# Patient Record
Sex: Male | Born: 1995 | Hispanic: Yes | Marital: Single | Smoking: Current every day smoker
Health system: Southern US, Community
[De-identification: ages and names within clinical notes are randomized; demographics above are authoritative.]

---

## 2019-08-01 ENCOUNTER — Encounter (HOSPITAL_COMMUNITY): Payer: Self-pay | Admitting: Emergency Medicine

## 2019-08-01 ENCOUNTER — Emergency Department (HOSPITAL_COMMUNITY)
Admission: EM | Admit: 2019-08-01 | Discharge: 2019-08-01 | Disposition: A | Discharge status: Home / Self Care | Payer: BC Managed Care – PPO | Attending: Emergency Medicine | Admitting: Emergency Medicine

## 2019-08-01 ENCOUNTER — Emergency Department (HOSPITAL_COMMUNITY): Payer: BC Managed Care – PPO

## 2019-08-01 ENCOUNTER — Other Ambulatory Visit: Payer: Self-pay

## 2019-08-01 DIAGNOSIS — R1084 Generalized abdominal pain: Secondary | ICD-10-CM | POA: Diagnosis not present

## 2019-08-01 DIAGNOSIS — Z79899 Other long term (current) drug therapy: Secondary | ICD-10-CM | POA: Diagnosis not present

## 2019-08-01 DIAGNOSIS — F17210 Nicotine dependence, cigarettes, uncomplicated: Secondary | ICD-10-CM | POA: Diagnosis not present

## 2019-08-01 LAB — URINALYSIS, ROUTINE W REFLEX MICROSCOPIC
Bilirubin Urine: NEGATIVE
Glucose, UA: NEGATIVE mg/dL
Hgb urine dipstick: NEGATIVE
Ketones, ur: NEGATIVE mg/dL
Leukocytes,Ua: NEGATIVE
Nitrite: NEGATIVE
Protein, ur: NEGATIVE mg/dL
Specific Gravity, Urine: 1.021 (ref 1.005–1.030)
pH: 5 (ref 5.0–8.0)

## 2019-08-01 LAB — CBC
HCT: 51.9 % (ref 39.0–52.0)
Hemoglobin: 17.2 g/dL — ABNORMAL HIGH (ref 13.0–17.0)
MCH: 28.9 pg (ref 26.0–34.0)
MCHC: 33.1 g/dL (ref 30.0–36.0)
MCV: 87.2 fL (ref 80.0–100.0)
Platelets: 213 10*3/uL (ref 150–400)
RBC: 5.95 MIL/uL — ABNORMAL HIGH (ref 4.22–5.81)
RDW: 12.5 % (ref 11.5–15.5)
WBC: 8.5 10*3/uL (ref 4.0–10.5)
nRBC: 0 % (ref 0.0–0.2)

## 2019-08-01 LAB — COMPREHENSIVE METABOLIC PANEL
ALT: 22 U/L (ref 0–44)
AST: 15 U/L (ref 15–41)
Albumin: 4.8 g/dL (ref 3.5–5.0)
Alkaline Phosphatase: 123 U/L (ref 38–126)
Anion gap: 9 (ref 5–15)
BUN: 18 mg/dL (ref 6–20)
CO2: 24 mmol/L (ref 22–32)
Calcium: 9.3 mg/dL (ref 8.9–10.3)
Chloride: 104 mmol/L (ref 98–111)
Creatinine, Ser: 0.87 mg/dL (ref 0.61–1.24)
GFR calc Af Amer: >60 mL/min (ref >60)
GFR calc non Af Amer: >60 mL/min (ref >60)
Glucose, Bld: 96 mg/dL (ref 70–99)
Potassium: 3.9 mmol/L (ref 3.5–5.1)
Sodium: 137 mmol/L (ref 135–145)
Total Bilirubin: 0.9 mg/dL (ref 0.3–1.2)
Total Protein: 8 g/dL (ref 6.5–8.1)

## 2019-08-01 LAB — RAPID URINE DRUG SCREEN, HOSP PERFORMED
Amphetamines: POSITIVE — AB
Barbiturates: NOT DETECTED
Benzodiazepines: NOT DETECTED
Cocaine: NOT DETECTED
Opiates: POSITIVE — AB
Tetrahydrocannabinol: POSITIVE — AB

## 2019-08-01 LAB — LIPASE, BLOOD: Lipase: 40 U/L (ref 11–51)

## 2019-08-01 MED ORDER — SODIUM CHLORIDE (PF) 0.9 % IJ SOLN
INTRAMUSCULAR | Status: AC
  Filled 2019-08-01: qty 50

## 2019-08-01 MED ORDER — SODIUM CHLORIDE 0.9 % IV BOLUS
1000.0000 mL | Freq: Once | INTRAVENOUS | Status: AC
  Administered 2019-08-01: 1000 mL via INTRAVENOUS

## 2019-08-01 MED ORDER — MORPHINE SULFATE (PF) 4 MG/ML IV SOLN
4.0000 mg | Freq: Once | INTRAVENOUS | Status: AC
  Administered 2019-08-01: 4 mg via INTRAVENOUS
  Filled 2019-08-01: qty 1

## 2019-08-01 MED ORDER — IOHEXOL 300 MG/ML  SOLN
100.0000 mL | Freq: Once | INTRAMUSCULAR | Status: AC | PRN
  Administered 2019-08-01: 15:00:00 100 mL via INTRAVENOUS

## 2019-08-01 NOTE — ED Provider Notes (Signed)
[facility]-EMERGENCY DEPT Provider Note   CSN: [id number] Arrival date & time: 08/01/19  0847     History   Chief Complaint Chief Complaint  Patient presents with  . Abdominal Pain  . Diarrhea  . Back Pain    HPI Zachary Maynard is a 23 y.o. male.     23 y.o male with no PMH presents to the ED with a chief complaint of abdominal pain x 4 days. He describes the pain as a constant sharp sensation with radiation from his RUQ to his back. He reports the pain is exacerbated with eating along with lying flat. His pain is also alleviated with pressing of his right upper quadrant. He has been drinking plenty of fluids without improvement in symptoms. He also endorses one episode of non bilious, non bloody emesis prior to arrival. A subjective fever was also recorded by patient's girlfriend on Saturday along with chills and one episode of diarrhea. Patient states he feels "food is not getting processed, my stomach is swollen". He denies any prior surgical history to his abdomen. His last bowel movement was Saturday, without any blood. He denies alcohol or drug use. No shortness of breath,  Of note, he is currently on day 2 of a steroid pack for a rash, likely acquired while at work.   The history is provided by the patient.  Abdominal Pain Associated symptoms: chills, constipation, diarrhea, fever, nausea and vomiting   Associated symptoms: no chest pain, no shortness of breath and no sore throat   Diarrhea Associated symptoms: abdominal pain, chills, fever and vomiting   Associated symptoms: no headaches   Back Pain Associated symptoms: abdominal pain and fever   Associated symptoms: no chest pain and no headaches     History reviewed. No pertinent past medical history.  There are no active problems to display for this patient.   History reviewed. No pertinent surgical history.      Home Medications    Prior to Admission medications   Medication Sig  Start Date End Date Taking? Authorizing Provider  cetirizine (ZYRTEC) 10 MG tablet Take 10 mg by mouth at bedtime. 07/24/19   [provider]  montelukast (SINGULAIR) 10 MG tablet Take 10 mg by mouth daily. 07/24/19   [provider]  permethrin (ELIMITE) 5 % cream Apply 1 application topically See admin instructions. Apply to skin from neck down to feet and wash off after 8 hours repeat in 1 week. 06/01/19   [provider]  predniSONE (DELTASONE) 10 MG tablet Take 10 mg by mouth 2 (two) times daily. 07/24/19   [provider]    Family History No family history on file.  Social History Social History   Tobacco Use  . Smoking status: Current Every Day Smoker    Types: Cigarettes  . Smokeless tobacco: Never Used  Substance Use Topics  . Alcohol use: Yes  . Drug use: Not on file     Allergies   Patient has no allergy information on record.   Review of Systems Review of Systems  Constitutional: Positive for chills and fever.  HENT: Negative for sneezing and sore throat.   Eyes: Negative for photophobia.  Respiratory: Negative for shortness of breath.   Cardiovascular: Negative for chest pain.  Gastrointestinal: Positive for abdominal pain, constipation, diarrhea, nausea and vomiting. Negative for abdominal distention and blood in stool.  Genitourinary: Negative for difficulty urinating, discharge and flank pain.  Musculoskeletal: Positive for back pain and neck pain.  Skin: Negative for pallor and wound.  Neurological: Negative for light-headedness and headaches.     Physical Exam Updated Vital Signs BP (!) 129/92   Pulse 69   Temp 98.2 F (36.8 C) (Oral)   Resp 16   SpO2 100%   Physical Exam Vitals signs and nursing note reviewed.  Constitutional:      Appearance: He is well-developed. He is ill-appearing.  HENT:     Head: Normocephalic and atraumatic.     Mouth/Throat:     Mouth: Mucous membranes are moist.  Cardiovascular:      Rate and Rhythm: Normal rate.  Pulmonary:     Effort: Pulmonary effort is normal.     Breath sounds: No wheezing, rhonchi or rales.     Comments: Lungs are clear to auscultation.  No wheezing, rhonchi, rales. Abdominal:     General: Abdomen is flat. Bowel sounds are decreased. There is distension.     Palpations: Abdomen is soft.     Tenderness: There is abdominal tenderness in the right upper quadrant and right lower quadrant. There is right CVA tenderness and guarding. There is no left CVA tenderness. Positive signs include McBurney's sign. Negative signs include Murphy's sign and psoas sign.     Comments: Bowel sounds are diminished.  There is guarding on exam.  Significant tenderness along the right upper quadrant.  Skin:    General: Skin is warm and dry.  Neurological:     Mental Status: He is alert and oriented to person, place, and time.      ED Treatments / Results  Labs (all labs ordered are listed, but only abnormal results are displayed) Labs Reviewed  CBC - Abnormal; Notable for the following components:      Result Value   RBC 5.95 (*)    Hemoglobin 17.2 (*)    All other components within normal limits  RAPID URINE DRUG SCREEN, HOSP PERFORMED - Abnormal; Notable for the following components:   Opiates POSITIVE (*)    Amphetamines POSITIVE (*)    Tetrahydrocannabinol POSITIVE (*)    All other components within normal limits  LIPASE, BLOOD  COMPREHENSIVE METABOLIC PANEL  URINALYSIS, ROUTINE W REFLEX MICROSCOPIC    EKG None  Radiology [place] Abdomen Complete  Result Date: 08/01/2019 CLINICAL DATA:  Right upper quadrant abdominal pain for 5 days EXAM: ABDOMEN ULTRASOUND COMPLETE COMPARISON:  None. FINDINGS: Gallbladder: No gallstones or wall thickening visualized. No sonographic Murphy sign noted by sonographer. Common bile duct: Diameter: 3 mm Liver: No focal lesion identified. Within normal limits in parenchymal echogenicity. Portal vein is patent on color  Doppler imaging with normal direction of blood flow towards the liver. IVC: No abnormality visualized. Pancreas: Largely obscured by overlying bowel gas. Spleen: Size and appearance within normal limits. Right Kidney: Length: 9.7 cm. Echogenicity within normal limits. No mass or hydronephrosis visualized. Left Kidney: Length: 11.3 cm. Echogenicity within normal limits. No mass or hydronephrosis visualized. Abdominal aorta: No aneurysm visualized. Other findings: None. IMPRESSION: 1. No acute sonographic findings within the abdomen. 2. Nonvisualization of the pancreas secondary to shadowing from overlying bowel gas. Electronically Signed   By: Duanne GuessNicholas  Plundo M.D.   On: 08/01/2019 12:38   Ct Abdomen Pelvis W Contrast  Result Date: 08/01/2019 CLINICAL DATA:  Abdominal pain EXAM: CT ABDOMEN AND PELVIS WITH CONTRAST TECHNIQUE: Multidetector CT imaging of the abdomen and pelvis was performed using the standard protocol following bolus administration of intravenous contrast. CONTRAST:  100mL OMNIPAQUE IOHEXOL 300 MG/ML  SOLN COMPARISON:  Ultrasound abdomen August 01, 2019 FINDINGS: Lower chest: Lung bases are clear. Hepatobiliary: No focal liver lesions are demonstrable. The gallbladder wall is not appreciably thickened. There is no biliary duct dilatation. Pancreas: There is no pancreatic mass or inflammatory focus. Spleen: No splenic lesions are evident. Adrenals/Urinary Tract: Adrenals bilaterally appear unremarkable. Kidneys bilaterally show no evident mass or hydronephrosis on either side. There is no evident renal or ureteral calculus on either side. Urinary bladder is midline with wall thickness within normal limits. Stomach/Bowel: There is no appreciable bowel wall or mesenteric thickening. Terminal ileum appears normal. There is no evident bowel obstruction. There is no appreciable free air or portal venous air. Vascular/Lymphatic: There is no abdominal aortic aneurysm. No vascular lesions are evident. No  adenopathy is evident in the abdomen or pelvis. Reproductive: Prostate and seminal vesicles are normal in size and contour. There is no evident pelvic mass. Other: Appendix region appears normal. No periappendiceal region inflammatory change. There is no evident abscess or ascites in the abdomen or pelvis. There is a small hernia slightly superior to the umbilicus containing fat but no bowel. Musculoskeletal: There are no blastic or lytic bone lesions. No intramuscular lesions evident. IMPRESSION: 1. A cause for patient's symptoms has not been established with this study. 2. No evident bowel obstruction. No abscess in the abdomen pelvis. Appendix appears normal. 3. No evident renal or ureteral calculus. No hydronephrosis. Urinary bladder wall thickness is within normal limits. 4. Small ventral hernia slightly superior to the umbilicus containing fat but no bowel. Electronically Signed   By: Lowella Grip III M.D.   On: 08/01/2019 15:34    Procedures Procedures (including critical care time)  Medications Ordered in ED Medications  sodium chloride (PF) 0.9 % injection (has no administration in time range)  sodium chloride 0.9 % bolus 1,000 mL (0 mLs Intravenous Stopped 08/01/19 1351)  morphine 4 MG/ML injection 4 mg (4 mg Intravenous Given 08/01/19 1201)  iohexol (OMNIPAQUE) 300 MG/ML solution 100 mL (100 mLs Intravenous Contrast Given 08/01/19 1451)     Initial Impression / Assessment and Plan / ED Course  I have reviewed the triage vital signs and the nursing notes.  Pertinent labs & imaging results that were available during my care of the patient were reviewed by me and considered in my medical decision making (see chart for details).  Clinical Course as of Jul 31 1644  Tue Aug 01, 2019  1643 Opiates(!): POSITIVE [JS]  1643 Amphetamines(!): POSITIVE [JS]  1643 Tetrahydrocannabinol(!): POSITIVE [JS]    Clinical Course User Index [JS] Janeece Fitting, PA-C      Patient with no  pertinent past medical history presents to the ED with complaints of right-sided abdominal pain for the past 4 days.  Patient also endorses some vomiting, chills, subjective fever.  He is also had some episodes of diarrhea however now reports constipation after his last bowel movement on Saturday.  He is overall nontoxic-appearing, arrived in the ED afebrile.  Has not really taken much over-the-counter medication for improvement in symptoms aside for continue to hydrate with plenty of liquids.  Does endorse somewhat anorexia.  Differential diagnoses included but not limited to cholelithiasis, nephrolithiasis, appendicitis versus enteritis.  CBC without any leukocytosis, hemoconcentrated.  CMP without any electrolyte derangement, creatinine level is within normal limits.  LFTs are unremarkable.  UA is currently pending.  Patient was given morphine to help with his symptoms along with fluids to help with hydration.  Will order ultrasound to further evaluate his  right upper quadrant pain.  Ultrasound of the right upper quadrant showed 1. No acute sonographic findings within the abdomen.  2. Nonvisualization of the pancreas secondary to shadowing from  overlying bowel gas.     Will order CT abdomen to further evaluate as there is some suspicion for obstruction: 1. A cause for patient's symptoms has not been established with this  study.    2. No evident bowel obstruction. No abscess in the abdomen pelvis.  Appendix appears normal.    3. No evident renal or ureteral calculus. No hydronephrosis. Urinary  bladder wall thickness is within normal limits.    4. Small ventral hernia slightly superior to the umbilicus  containing fat but no bowel.     These results were discussed with patient at length.  Patient will be provided with a p.o. challenge.  UA was positive for THC, amphetamines, opiates.  He did have one episode of not feeling unwell after CT contrast, some suspicion this likely had to  do due to drugs on board.   Patient is otherwise well-appearing, nontoxic vital signs, tolerating p.o.  We will have him follow-up with PCP as needed.  I have discussed patient with Dr. Stevie Kern who agrees with plan and management.    Portions of this note were generated with Scientist, clinical (histocompatibility and immunogenetics). Dictation errors may occur despite best attempts at proofreading.  Final Clinical Impressions(s) / ED Diagnoses   Final diagnoses:  Generalized abdominal pain    ED Discharge Orders    None       Claude Manges, PA-C 08/01/19 1645    Milagros Loll, MD 08/02/19 1015

## 2019-08-01 NOTE — ED Triage Notes (Signed)
Pt c/o abd pains that is worse after eating.pain radiates to back. Also having diarrhea. Is currently taking steroids for rash/allergy since last Monday.

## 2019-08-01 NOTE — ED Notes (Signed)
Pt was able to keep down crackers and water that was offered. He states he does still have some lingering nausea.

## 2019-08-01 NOTE — Discharge Instructions (Addendum)
Your laboratory results along with CT scan today were negative.  Please continue to hydrate with plenty of liquids along with Gatorade.Please refrain from any THC use.   A referral for [place] and wellness has been given to you, please schedule an appointment in order to establish primary care.

## 2020-05-12 IMAGING — CT CT ABD-PELV W/ CM
2 of 4 series · 15 of 46 positions shown, 17 images · IV contrast (omnipaque)
Comparison: Ultrasound abdomen August 01, 2019

CLINICAL DATA: Abdominal pain

EXAM:
CT ABDOMEN AND PELVIS WITH CONTRAST
TECHNIQUE: Multidetector CT imaging of the abdomen and pelvis was performed
using the standard protocol following bolus administration of
intravenous contrast.
CONTRAST:  100mL OMNIPAQUE IOHEXOL 300 MG/ML  SOLN

[Series 2: axial st · axial · 0.80mm/px · z∈[+1018,+1458]mm · 12 of 98 slices shown, 14 images]
[im 5/98  soft-tissue]
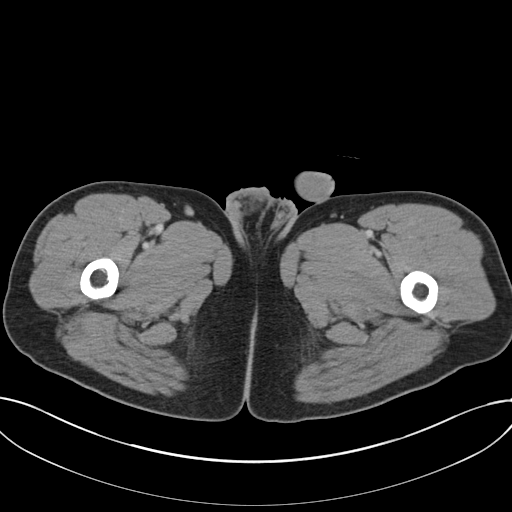
[im 5/98  bone]
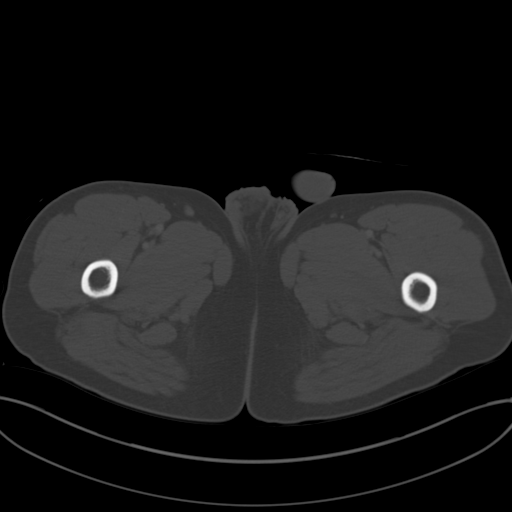
[im 14/98  soft-tissue]
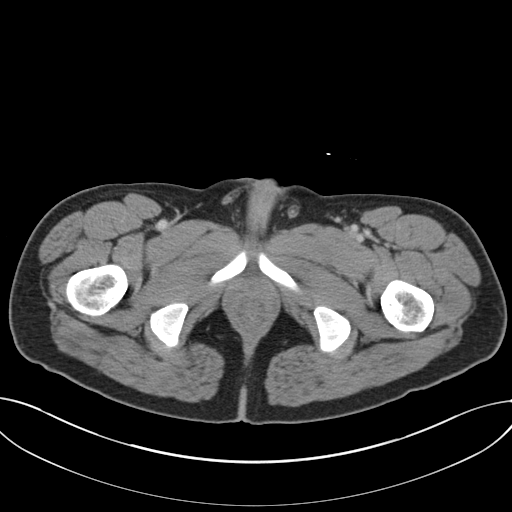
[im 24/98  soft-tissue]
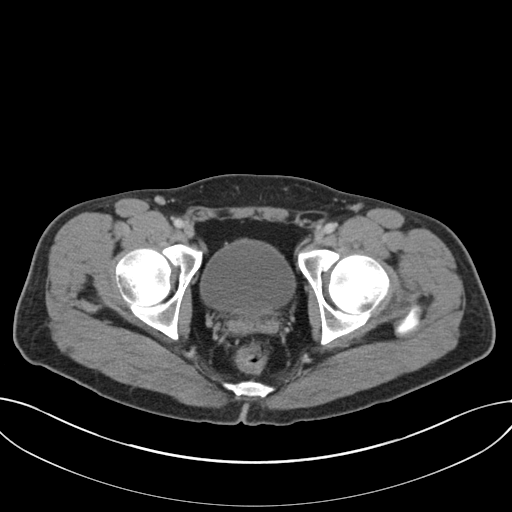
[im 28/98  soft-tissue]
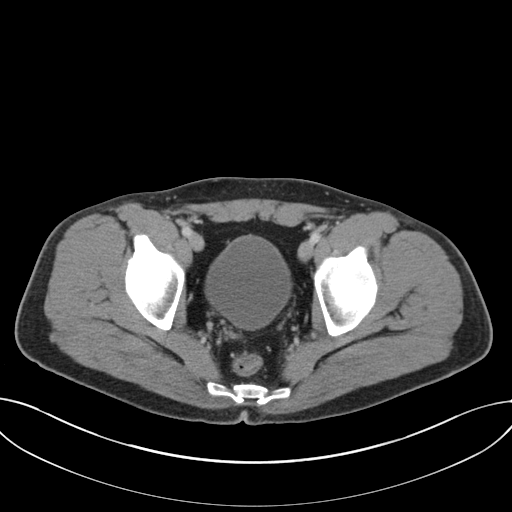
[im 37/98  soft-tissue]
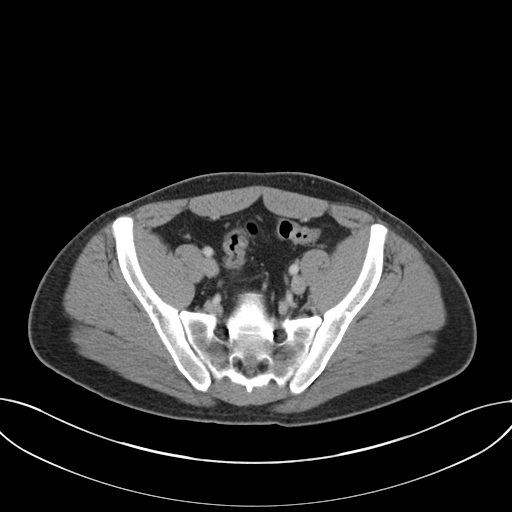
[im 47/98  soft-tissue]
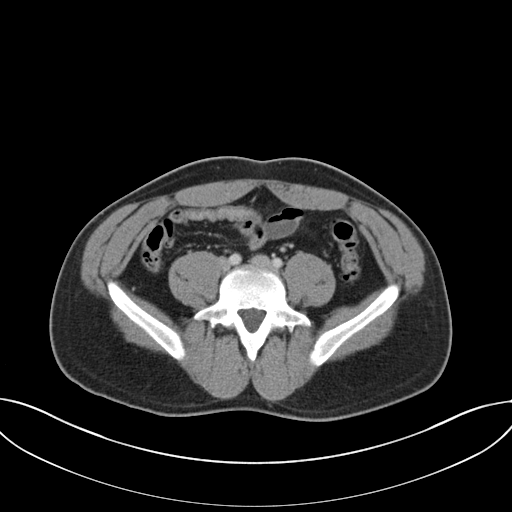
[im 51/98  soft-tissue]
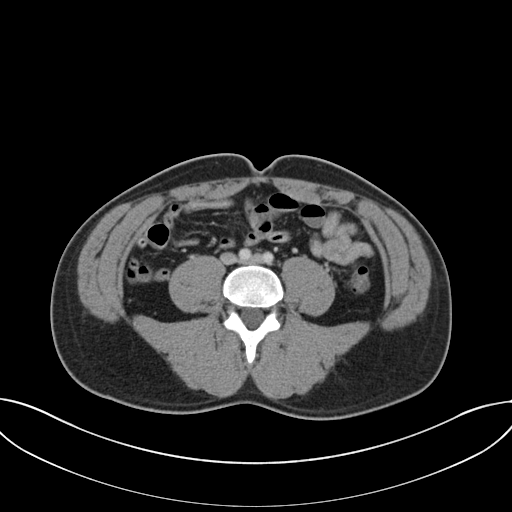
[im 61/98  soft-tissue]
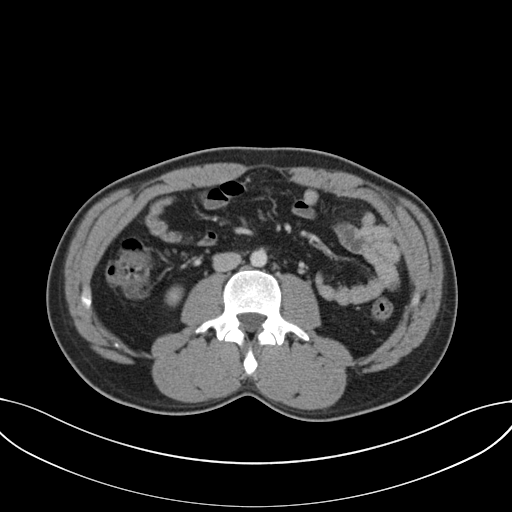
[im 70/98  soft-tissue]
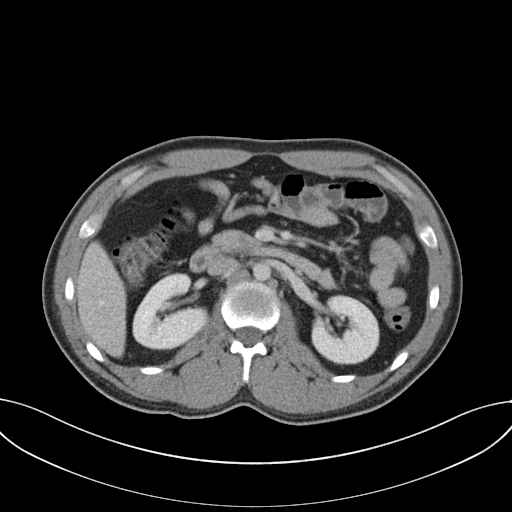
[im 70/98  bone]
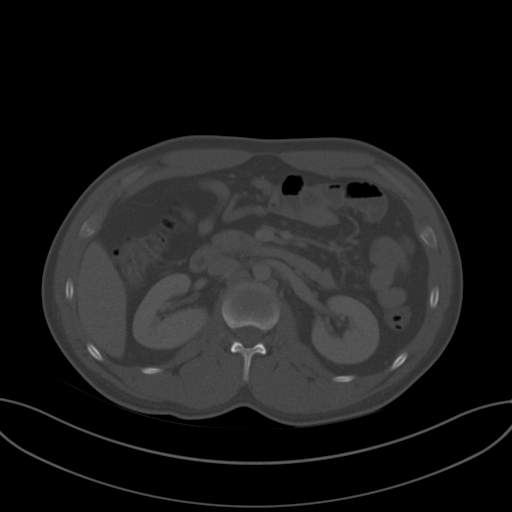
[im 74/98  soft-tissue]
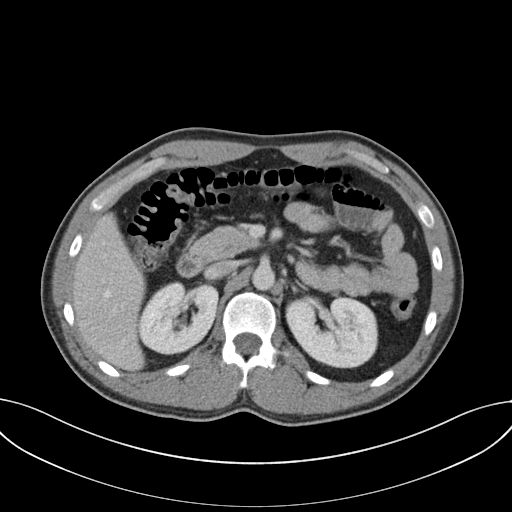
[im 84/98  soft-tissue]
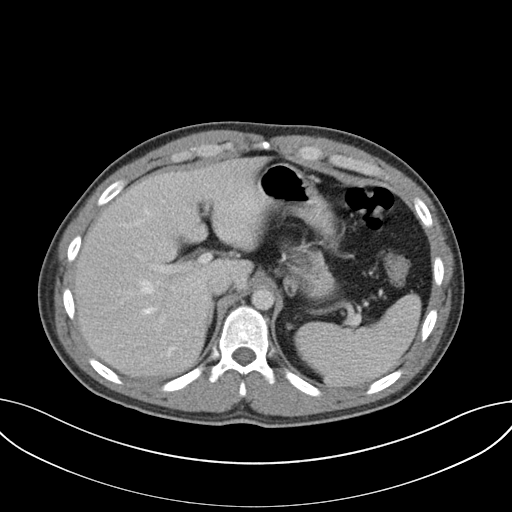
[im 93/98  soft-tissue]
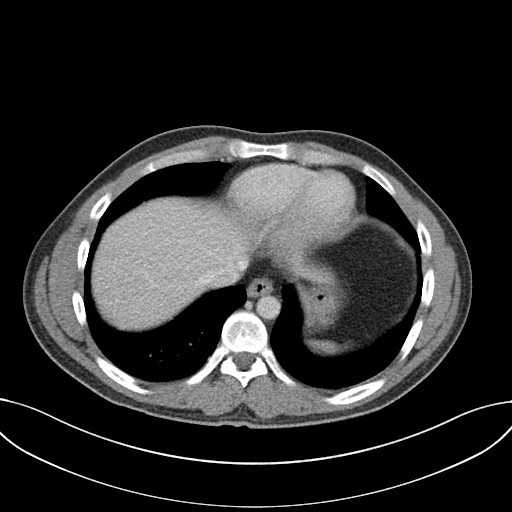

[Series 4: coronal st · coronal · 0.70mm/px · 3 of 130 slices shown]
[im 44/130  soft-tissue]
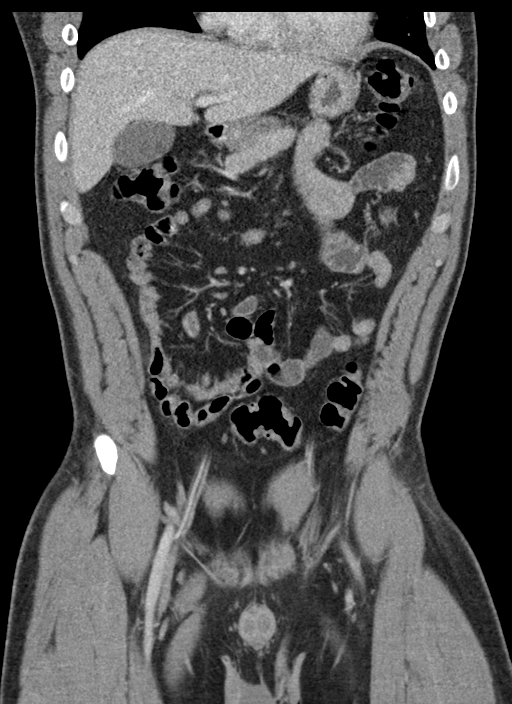
[im 58/130  soft-tissue]
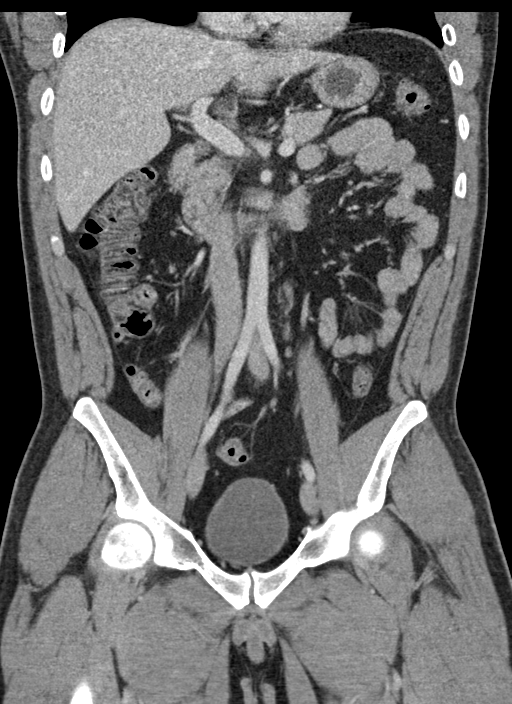
[im 72/130  soft-tissue]
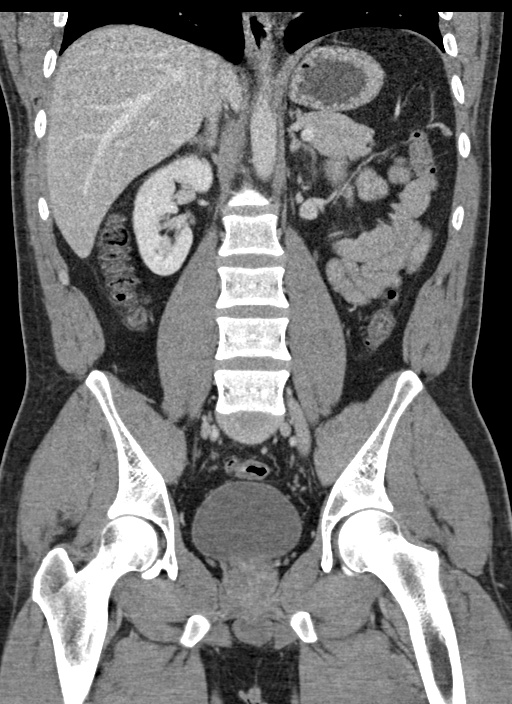

[15 of 46 positions shown; findings below may reference images not displayed]

FINDINGS: Lower chest: Lung bases are clear.

Hepatobiliary: No focal liver lesions are demonstrable. The
gallbladder wall is not appreciably thickened. There is no biliary
duct dilatation.

Pancreas: There is no pancreatic mass or inflammatory focus.

Spleen: No splenic lesions are evident.

Adrenals/Urinary Tract: Adrenals bilaterally appear unremarkable.
Kidneys bilaterally show no evident mass or hydronephrosis on either
side. There is no evident renal or ureteral calculus on either side.
Urinary bladder is midline with wall thickness within normal limits.

Stomach/Bowel: There is no appreciable bowel wall or mesenteric
thickening. Terminal ileum appears normal. There is no evident bowel
obstruction. There is no appreciable free air or portal venous air.

Vascular/Lymphatic: There is no abdominal aortic aneurysm. No
vascular lesions are evident. No adenopathy is evident in the
abdomen or pelvis.

Reproductive: Prostate and seminal vesicles are normal in size and
contour. There is no evident pelvic mass.

Other: Appendix region appears normal. No periappendiceal region
inflammatory change. There is no evident abscess or ascites in the
abdomen or pelvis. There is a small hernia slightly superior to the
umbilicus containing fat but no bowel.

Musculoskeletal: There are no blastic or lytic bone lesions. No
intramuscular lesions evident.
IMPRESSION: 1. A cause for patient's symptoms has not been established with this
study.

2. No evident bowel obstruction. No abscess in the abdomen pelvis.
Appendix appears normal.

3. No evident renal or ureteral calculus. No hydronephrosis. Urinary
bladder wall thickness is within normal limits.

4. Small ventral hernia slightly superior to the umbilicus
containing fat but no bowel.

## 2020-05-12 IMAGING — US US ABDOMEN COMPLETE
1 series · 14 of 25 positions shown · non-contrast
Comparison: None.

CLINICAL DATA: Right upper quadrant abdominal pain for 5 days

EXAM:
ABDOMEN ULTRASOUND COMPLETE

[Series 1: us abdomen complete · 14 of 86 slices shown]
[im 1/86]
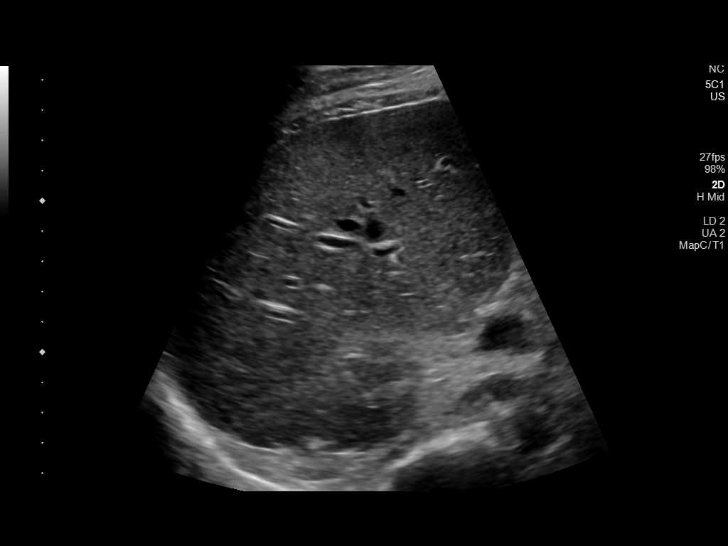
[im 8/86]
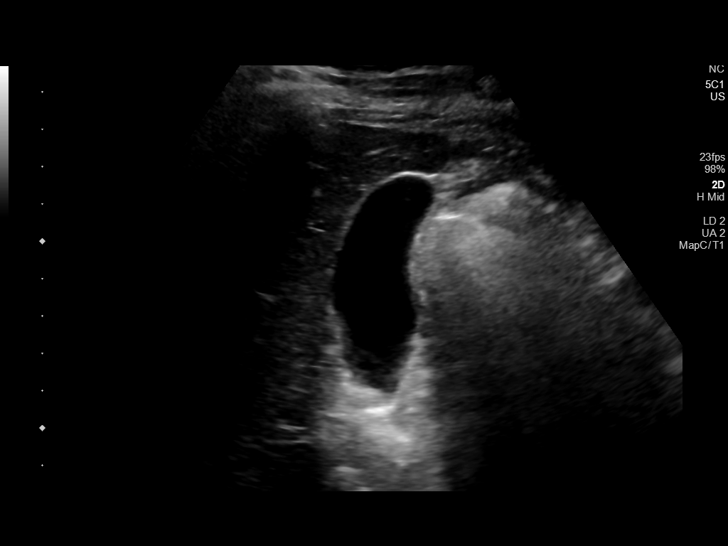
[im 15/86]
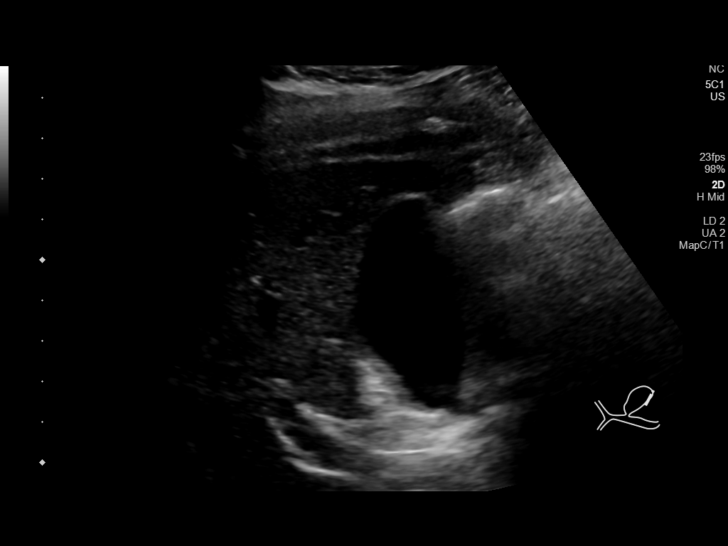
[im 22/86]
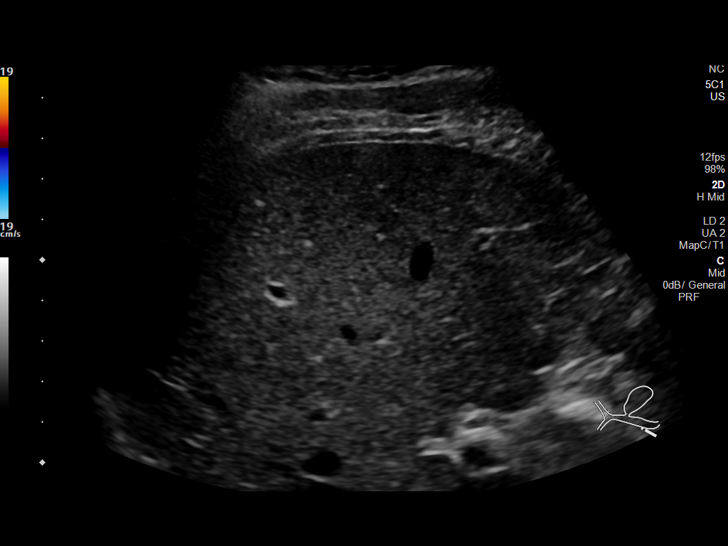
[im 29/86]
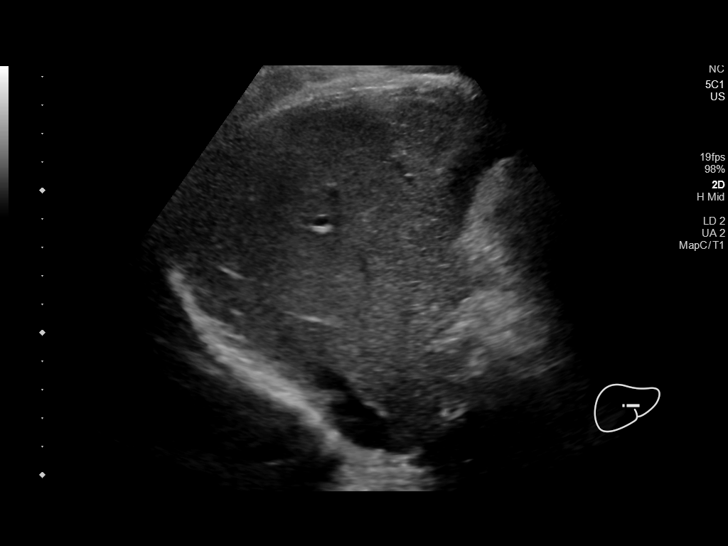
[im 32/86]
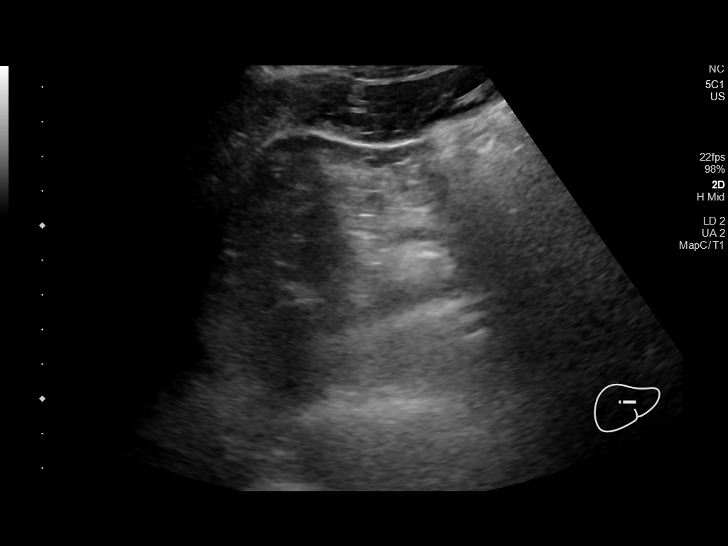
[im 39/86]
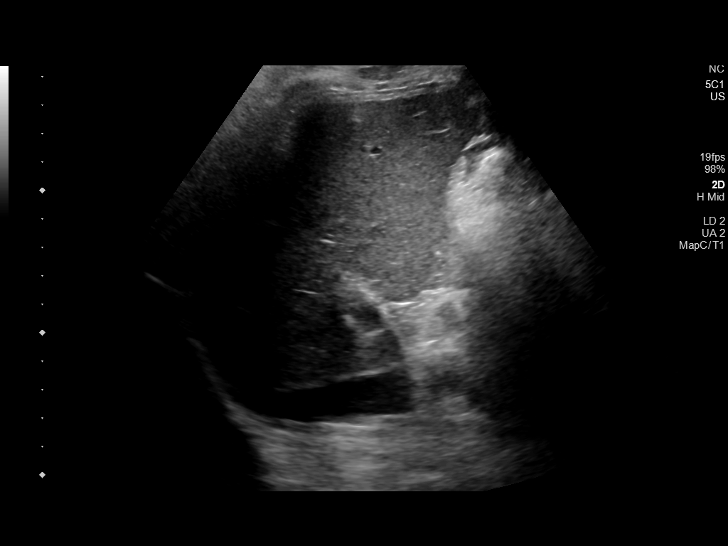
[im 47/86]
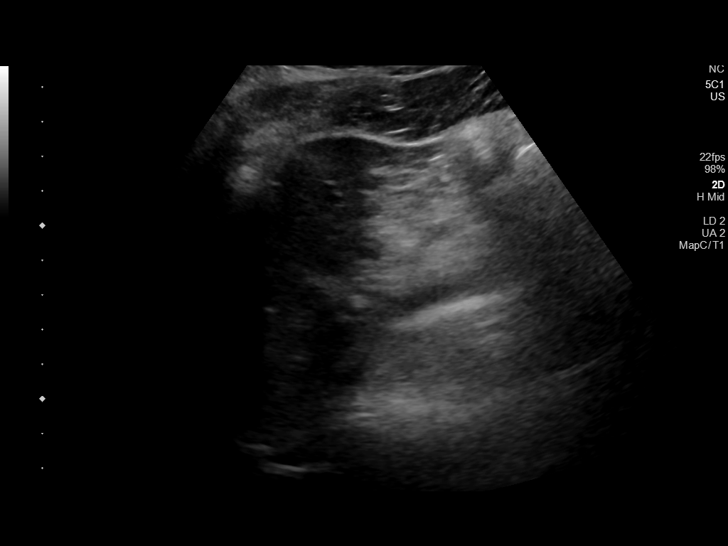
[im 54/86]
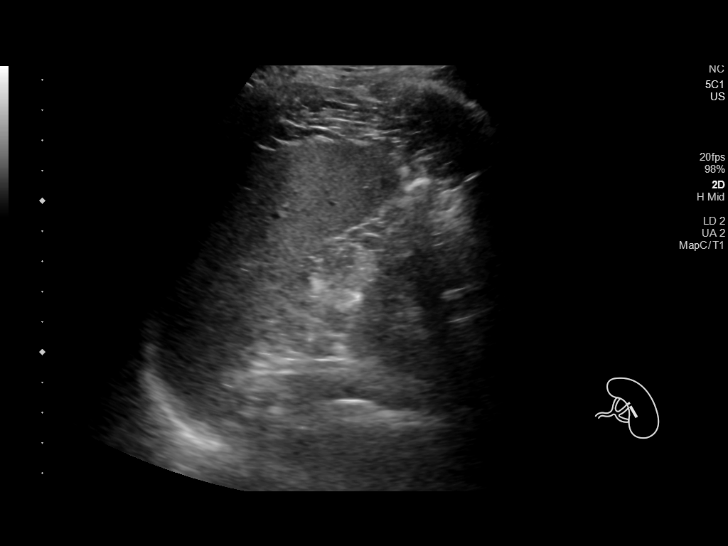
[im 57/86]
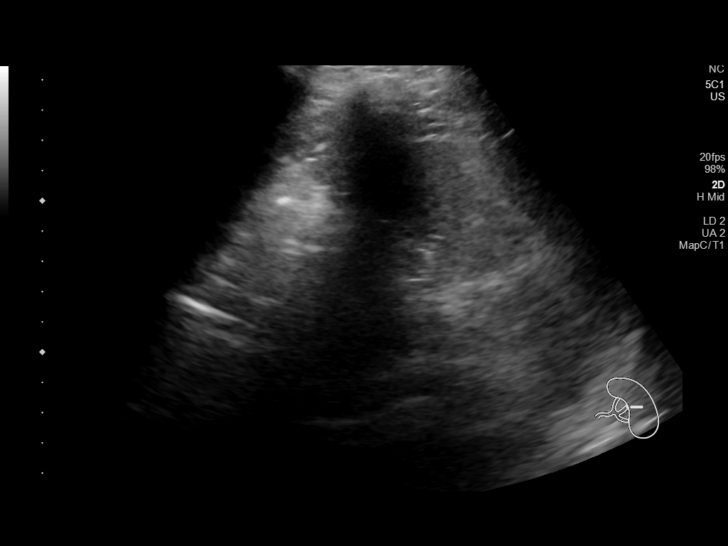
[im 64/86]
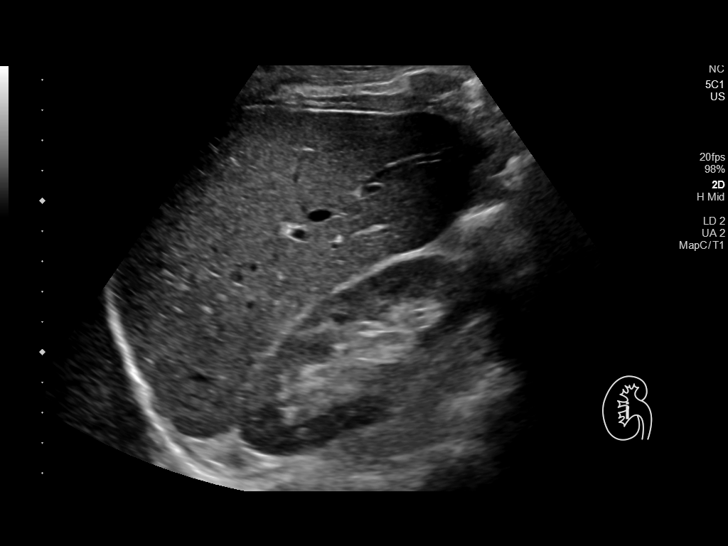
[im 71/86]
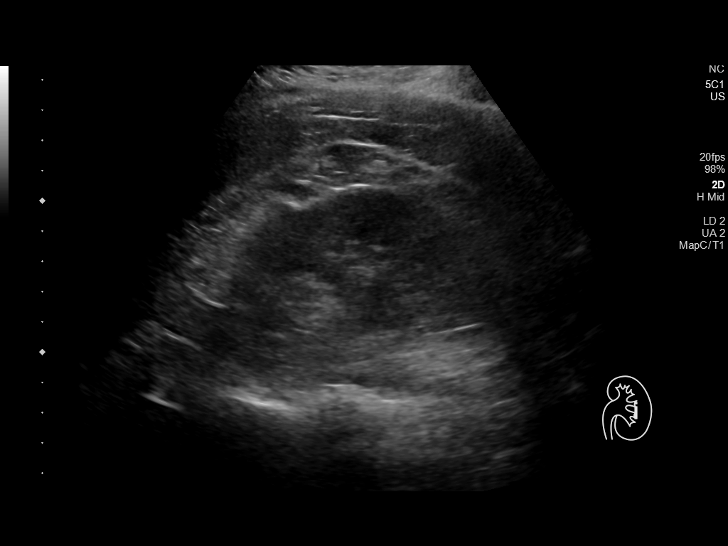
[im 78/86]
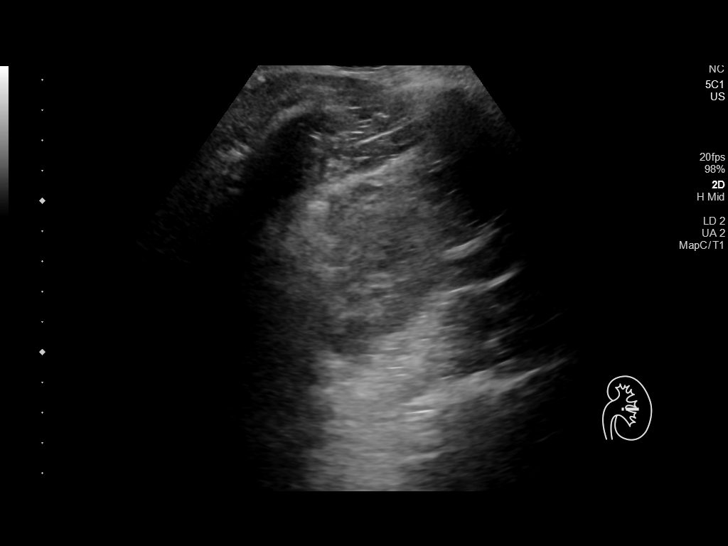
[im 86/86]
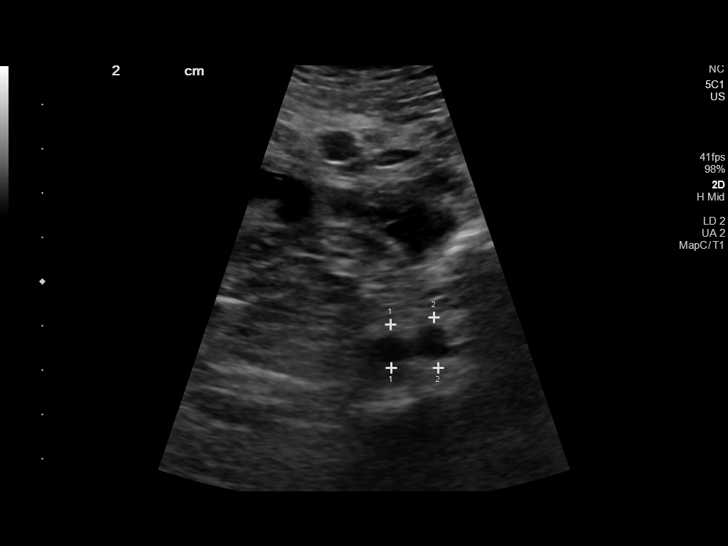

[14 of 25 positions shown; findings below may reference images not displayed]

FINDINGS: Gallbladder: No gallstones or wall thickening visualized. No
sonographic Murphy sign noted by sonographer.

Common bile duct: Diameter: 3 mm

Liver: No focal lesion identified. Within normal limits in
parenchymal echogenicity. Portal vein is patent on color Doppler
imaging with normal direction of blood flow towards the liver.

IVC: No abnormality visualized.

Pancreas: Largely obscured by overlying bowel gas.

Spleen: Size and appearance within normal limits.

Right Kidney: Length: 9.7 cm. Echogenicity within normal limits. No
mass or hydronephrosis visualized.

Left Kidney: Length: 11.3 cm. Echogenicity within normal limits. No
mass or hydronephrosis visualized.

Abdominal aorta: No aneurysm visualized.

Other findings: None.
IMPRESSION: 1. No acute sonographic findings within the abdomen.
2. Nonvisualization of the pancreas secondary to shadowing from
overlying bowel gas.

## 2023-06-03 ENCOUNTER — Ambulatory Visit: Payer: Self-pay

## 2023-06-03 NOTE — Telephone Encounter (Signed)
Chief Complaint: Earache  Symptoms: Right ear pain 8/10, stiff neck, some blood from ear noted Frequency: constant  Pertinent Negatives: Patient denies chest pain, drainage from ear  Disposition: [] ED /[x] Urgent Care (no appt availability in office) / [] Appointment(In office/virtual)/ []  [place] [facility]/ [] Home Care/ [] Refused Recommended Disposition /[] [place] [facility]/ []  Follow-up with PCP Additional Notes: Patient states he was injured at work last week and a few hours later his right ear began to hurt. Patient stated he also had a runny nose, dry cough and congestion last week. Patient reports he got in the pool yesterday and got water in his ear and that made the pain worse. Now reporting ear pain 8/10 and it feels full of fluid. Patient stated he put a cotton ball in the right ear and when he removed it blood was on the cotton ball. Care advice was given, patient does not have a PCP at this time. Advised patient to seek care at Urgent Care. Offered an appointment at urgent care, patient declined and stated he would head to urgent care now and be seen as a walk-in.   Reason for Disposition  Earache  (Exceptions: brief ear pain of < 60 minutes duration, earache occurring during air travel  Answer Assessment - Initial Assessment Questions 1. LOCATION: "Which ear is involved?"     Right ear 2. ONSET: "When did the ear start hurting"      Last week 3. SEVERITY: "How bad is the pain?"  (Scale 1-10; mild, moderate or severe)   - MILD (1-3): doesn't interfere with normal activities    - MODERATE (4-7): interferes with normal activities or awakens from sleep    - SEVERE (8-10): excruciating pain, unable to do any normal activities      8/10 4. URI SYMPTOMS: "Do you have a runny nose or cough?"     Yes, runny nose, congestion, dry cough 5. FEVER: "Do you have a fever?" If Yes, ask: "What is your temperature, how was it measured, and when did it start?"     No  6. CAUSE: "Have you  been swimming recently?", "How often do you use Q-TIPS?", "Have you had any recent air travel or scuba diving?"     Yes, I got in a pool yesterday and now it feels worse  7. OTHER SYMPTOMS: "Do you have any other symptoms?" (e.g., headache, stiff neck, dizziness, vomiting, runny nose, decreased hearing)     Decreased hearing, stiff neck  Protocols used: Earache-A-AH

## 2024-04-13 ENCOUNTER — Ambulatory Visit (INDEPENDENT_AMBULATORY_CARE_PROVIDER_SITE_OTHER): Admitting: Allergy and Immunology

## 2024-04-13 ENCOUNTER — Encounter: Payer: Self-pay | Admitting: Allergy and Immunology

## 2024-04-13 ENCOUNTER — Ambulatory Visit: Admitting: Allergy and Immunology

## 2024-04-13 VITALS — BP 120/68 | HR 55 | Resp 16 | Ht 64.0 in | Wt 190.0 lb

## 2024-04-13 DIAGNOSIS — L2089 Other atopic dermatitis: Secondary | ICD-10-CM

## 2024-04-13 DIAGNOSIS — J3089 Other allergic rhinitis: Secondary | ICD-10-CM | POA: Diagnosis not present

## 2024-04-13 DIAGNOSIS — J4520 Mild intermittent asthma, uncomplicated: Secondary | ICD-10-CM

## 2024-04-13 DIAGNOSIS — F1290 Cannabis use, unspecified, uncomplicated: Secondary | ICD-10-CM

## 2024-04-13 DIAGNOSIS — L253 Unspecified contact dermatitis due to other chemical products: Secondary | ICD-10-CM

## 2024-04-13 NOTE — Patient Instructions (Addendum)
  1. Apply Vtama - 1 time per day to all areas of eczema   2. Return to clinic for skin testing  3. Arrange for NACDG patch testing including all metals in [place]  4. Continue all other medications   5. Replace smoke exposure with substitutes

## 2024-04-13 NOTE — Progress Notes (Signed)
 [place] [facility] [place] - [place] - [place] -    NEW PATIENT NOTE  Referring Provider: No ref. provider found Primary Provider: Pcp, No Date of office visit: 04/13/2024    Subjective:   Chief Complaint:  Zachary Maynard (DOB: [birth year]) is a 28 y.o. male who presents to the clinic on 04/13/2024 with a chief complaint of Eczema .     HPI: Nikolas presents to this clinic in evaluation of dermatitis.  He is followed by [facility] dermatology and has been treated with 2 oral Jak inhibitors and injectable IL-4/13 biologic agent without success regarding his diffuse eczematous dermatitis.  This dermatitis started about 5 years ago and it has been resistant to most therapy.  The only thing that seems to help is injections of systemic steroids.  And this steroid driven improvement is very transient.  He has tried multiple topical agents as well and is currently using clobetasol.  In addition, he has lots of sneezing especially when exposed to grass.  He has had wheezing and coughing and phlegm production when being exposed to grass.  He can no longer cut his lawn.    He is smoking marijuana on and off.  History reviewed. No pertinent past medical history.  History reviewed. No pertinent surgical history.  Allergies as of 04/13/2024   No Known Allergies      Medication List    cetirizine 10 MG tablet Commonly known as: ZYRTEC Take 10 mg by mouth at bedtime.   Cibinqo 100 MG Tabs Generic drug: Abrocitinib Take 1 tablet by mouth daily.   Clobetasol Propionate E 0.05 % emollient cream Generic drug: Clobetasol Prop Emollient Base Apply topically 2 (two) times daily.   montelukast 10 MG tablet Commonly known as: SINGULAIR Take 10 mg by mouth daily.   omeprazole 40 MG capsule Commonly known as: PRILOSEC Take 40 mg by mouth daily.    Review of systems negative except as noted in HPI / PMHx or noted below:  Review of Systems  Constitutional: Negative.   HENT:  Negative.    Eyes: Negative.   Respiratory: Negative.    Cardiovascular: Negative.   Gastrointestinal: Negative.   Genitourinary: Negative.   Musculoskeletal: Negative.   Skin: Negative.   Neurological: Negative.   Endo/Heme/Allergies: Negative.   Psychiatric/Behavioral: Negative.      Family History  Problem Relation Age of Onset   Asthma Niece    Immunodeficiency Neg Hx    Eczema Neg Hx    Angioedema Neg Hx    Allergic rhinitis Neg Hx    Atopy Neg Hx     Social History   Socioeconomic History   Marital status: Single    Spouse name: Not on file   Number of children: Not on file   Years of education: Not on file   Highest education level: Not on file  Occupational History   Not on file  Tobacco Use   Smoking status: Every Day    Types: Cigarettes   Smokeless tobacco: Never   Tobacco comments:    A few years   Vaping Use   Vaping status: Some Days  Substance and Sexual Activity   Alcohol use: Yes    Comment: weekends   Drug use: Never   Sexual activity: Not on file  Other Topics Concern   Not on file  Social History Narrative   Not on file   Environmental and Social history  He lives in a house with a dry environment, no animals located inside the  household, no carpet in the bedroom, no plastic on the bed, no plastic on the pillow, no smoking ongoing inside the household.  He does smoke marijuana on occasion.  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  Objective:   Vitals:   04/13/24 1436  BP: 120/68  Pulse: (!) 55  Resp: 16  SpO2: 97%   Height: 5' 4 (162.6 cm) Weight: 190 lb (86.2 kg)  Physical Exam Constitutional:      Appearance: He is not diaphoretic.  HENT:     Head: Normocephalic.     Right Ear: Tympanic membrane, ear canal and external ear normal.     Left Ear: Tympanic membrane, ear canal and external ear normal.     Nose: Nose normal. No mucosal edema or rhinorrhea.     Mouth/Throat:      Pharynx: Uvula midline. No oropharyngeal exudate.  Eyes:     Conjunctiva/sclera: Conjunctivae normal.  Neck:     Thyroid: No thyromegaly.     Trachea: Trachea normal. No tracheal tenderness or tracheal deviation.  Cardiovascular:     Rate and Rhythm: Normal rate and regular rhythm.     Heart sounds: Normal heart sounds, S1 normal and S2 normal. No murmur heard. Pulmonary:     Effort: No respiratory distress.     Breath sounds: Normal breath sounds. No stridor. No wheezing or rales.  Lymphadenopathy:     Head:     Right side of head: No tonsillar adenopathy.     Left side of head: No tonsillar adenopathy.     Cervical: No cervical adenopathy.  Skin:    Findings: Rash (Diffuse indurated lichenified scale with erythema hands patches back patches abdomen antecubital fossa forearms.) present. No erythema.     Nails: There is no clubbing.  Neurological:     Mental Status: He is alert.     Diagnostics: Allergy skin tests were not performed.   Spirometry was performed and demonstrated an FEV1 of 3.71 @ 108 % of predicted. FEV1/FVC = 0.85  Assessment and Plan:    1. Other atopic dermatitis   2. Contact dermatitis due to chemicals   3. Other allergic rhinitis   4. Asthma, mild intermittent, well-controlled   5. Marijuana smoker    1. Apply Vtama  - 1 time per day to all areas of eczema   2. Return to clinic for skin testing  3. Arrange for NACDG patch testing including all metals in [place]  4. Continue all other medications   5. Replace smoke exposure with substitutes   Allante has a history consistent with contact dermatitis and his lack of response to multiple biologic agents Driminate atopic dermatitis suggest that there is another etiologic agent other than T2 driven inflammation contributing to his issue.  It does sound as though he has an atopic immune system and this may be an issue driven with some of his respiratory tract symptoms and we will see if he is atopic by  skin testing him in this clinic in the near future.  Of course if he stops smoke exposure his airway would be much healthier and I had a talk with him today about that issue.  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  Once we get all that information collected we can have a better roadmap of how to  address his issue.  He will use Vtama  for his eczema at this point.  Camellia DOROTHA Denis, MD Allergy / Immunology [facility] 

## 2024-04-17 ENCOUNTER — Encounter: Payer: Self-pay | Admitting: Allergy and Immunology

## 2024-04-17 MED ORDER — VTAMA 1 % EX CREA: 1.0000 | TOPICAL_CREAM | Freq: Every day | CUTANEOUS | 2 refills | Status: DC | PRN

## 2024-04-18 ENCOUNTER — Telehealth: Payer: Self-pay

## 2024-04-18 NOTE — Telephone Encounter (Signed)
 Denied. Per the health plan's preferred drug list, at least 2 preferred drugs must be tried before requesting this drug or tell us  why the member cannot try any preferred alternatives. Please send us  supporting chart notes and lab results. Here is list of preferred alternatives: desonide cream / ointment (generic for DesOwen), hydrocortisone cream / lotion / ointment (generic for Hytone), fluticasone cream / ointment (generic for Cutivate), mometasone cream / ointment / solution (generic for Elocon), betamethasone valerate cream / ointment (generic for Valisone), triamcinolone acetonide cream / lotion / ointment (generic for Kenalog), fluocinonide cream / gel / ointment / solution (generic for Lidex), clobetasol cream / emollient cream / gel / ointment (generic for Temovate), clobetasol solution (generic for Cormax), halobetasol propionate cream / ointment (generic for Ultravate), tacrolimus ointment (generic for Protopic), Eucrisa 2% ointment. Per our records, the member has already tried clobetasol. Note: Some preferred drug(s) may have quantity limits. Refer to the health plan's preferred drug list for additional details.

## 2024-04-18 NOTE — Telephone Encounter (Signed)
*  AA  Pharmacy Patient Advocate Encounter   Received notification from CoverMyMeds that prior authorization for Vtama  1% cream  is required/requested.   Insurance verification completed.   The patient is insured through [facility] [place] .   Per test claim: PA required; PA submitted to above mentioned insurance via CoverMyMeds Key/confirmation #/EOC [id number] Status is pending

## 2024-04-18 NOTE — Telephone Encounter (Signed)
 Information has been sent to clinical pharmacist for appeals review. It may take 5-7 days to prepare the necessary documentation to request the appeal from the insurance.

## 2024-04-19 ENCOUNTER — Telehealth: Payer: Self-pay | Admitting: Pharmacist

## 2024-04-19 NOTE — Telephone Encounter (Signed)
 Appeal has been submitted for Vtama . Will advise when response is received, please be advised that most companies may take 30 days to make a decision. Appeal letter and supporting documentation have been faxed to [phone] on 04/19/2024 at 2:06 pm.  Thank you, Devere Pandy, PharmD Clinical Pharmacist  [facility]  Direct Dial: [phone]

## 2024-04-20 ENCOUNTER — Ambulatory Visit (INDEPENDENT_AMBULATORY_CARE_PROVIDER_SITE_OTHER): Admitting: Allergy and Immunology

## 2024-04-20 DIAGNOSIS — J3089 Other allergic rhinitis: Secondary | ICD-10-CM | POA: Diagnosis not present

## 2024-04-20 DIAGNOSIS — L2089 Other atopic dermatitis: Secondary | ICD-10-CM

## 2024-04-20 DIAGNOSIS — J301 Allergic rhinitis due to pollen: Secondary | ICD-10-CM

## 2024-04-20 NOTE — Patient Instructions (Signed)
  1. Apply Vtama  - 1 time per day to all areas of eczema   2. Allergen avoidance measures - cat, dog, tree, grass, weed, mold  3. Arrange for NACDG patch testing including all metals in [place]  4. Continue all other medications  5. Replace smoke exposure with substitutes

## 2024-04-24 ENCOUNTER — Encounter: Payer: Self-pay | Admitting: Allergy and Immunology

## 2024-04-24 NOTE — Progress Notes (Signed)
 Zachary Maynard returns to this clinic for skin testing.  Allergy  skin testing identified hypersensitivity against tree, grass, weed, cat, dog, Fusarium.  He also demonstrated hypersensitivity and peanut, soybean, cashew, almond, hazelnut.  Allergen avoidance measures were provided.

## 2024-05-01 NOTE — Telephone Encounter (Signed)
 Received faxed approval on appeal for Vtama  cream. Approved from 04/18/24 through 04/28/25 for quantity of 60 grams. Scanned letter into media.

## 2024-05-10 ENCOUNTER — Other Ambulatory Visit (HOSPITAL_COMMUNITY): Payer: Self-pay

## 2024-05-15 ENCOUNTER — Encounter: Admitting: Family Medicine

## 2024-05-15 NOTE — Progress Notes (Deleted)
 Follow-up Note  RE: Zachary Maynard MRN: [id number] DOB: [birth year] Date of Office Visit: 05/15/2024  Primary care provider: Pcp, No Referring provider: No ref. provider found   Zachary Maynard returns to the office today for the patch test placement, given suspected history of contact dermatitis.    Diagnostics: NAC 80 patches placed NAC-80 (1-80)   1. Ammonium persulfate  2. [place] Balsam  3. Omitted  4. 4-tert-Butylphenolformaldehyde resin (PTBP)  5. Bacitracin  6. Budesonide  7. Quaternium-15  8. Cinnamal  9. Cobalt(II) chloride hexahydrate  10. Colophonium  11. Methyldibromo glutaronitrile  12. Decyl Glucoside  13. Ethylenediamine dihydrochloride  14. 2-Hydroxyethyl methacrylate  15. Hydroperoxides of Linalool  16. Iodopropynyl butylcarbamate  17. 2-Mercaptobenzothiazole (MBT)  18. Thiuram mix  19. METHYLISOTHIAZOLINONE  20. Propylene glycol  21. 1,3-Diphenylguanidine  22. Hydroperoxides of Limonene  23. Black rubber mix  24. Carba mix  25. Fragrance mix I  26. Fragrance mix II  27. Textile dye mix II  28. Neomycin sulfate  29. Nickel(II) sulfate hexahydrate  30. p-Phenylenediamine (PPD)  31. Potassium dichromate  32. Propolis  33. Sodium Metabisulfite  34. Tixocortol-21-pivalate  35. Lanolin alcohol  36. Methylisothiazolinone + Methylchloroisothiazolinone  37. Cocamidopropyl betaine  38. 3-(Dimethylamino)-1-propylamine  39. Formaldehyde  40. Oleamidopropyl dimethylamine  41. 2-Bromo-2-Nitropropane-l,3-diol  42. Diazolidinyl urea  43. DMDM Hydantoin  44. Epoxy resin, Bisphenol A  45. Benzophenone-4  46. Imidazolidinyl urea  47. Lauryl polyglucose  48 Methyl methacrylate  49. Paraben mix  50. Mercapto mix  51. Caine mix III  52. Mixed dialkyl thiourea  53. Compositae mix II  54. Toluenesulfonamide formaldehyde resin  55. Tea Tree Oil oxidized  56. Ylang-Ylang oil  57. Amidoamine  58. Amerchol L 101  59. Benzocaine  60. Benzyl alchohol  61. Benzyl  salicylate  62. Chloroxylenol (PCMX)  63. Cocamide DEA  64. Clobetasol-17-propionate  65. Toluene-2,5-Diamine sulfate  66. Ethyl acrylate  67. N-Isopropyl-N-phenyl--4-phenylenediamine (IPPD)  68. Lidocaine  69. omitted  70. Sesquiterpene lactone mix  71. 2-n-Octyl-4-isothiazolin-3-one  72. Propyl gallate  73. Polymyxin B sulfate  74. Pramoxine hydrochloride  75. Sodium benzoate  76. Sorbitan oleate  77. Sorbitan sesquioleate  78. Tocopherol  79. BENZALKONIUM CHLORIDE  80. Chlorhexidine digluconate   Metals patch testing:  chromium chloride 1%, potassium dichromate 0.25%, cobalt chloride hexahydrate 1%, copper sulfate pentahydrate 2%, molybdenum chloride 0.5%, titanium 0.1%, tantal, magnese chloride 0.5%, nickel sulfate hexahydrate 5%, aluminum hydroxide 10%, and vanadium pentoxide 10%.  Discussed with patient that patch testing tests for contact dermatitis and sometimes it does not correlate to how one will react to metals in the body. Positive patch testing results can help in avoiding those items however it is possible to get false negative results.  Nevertheless, this is the most accessible test for metal sensitivity currently available.  Metal Patches placed today. Please avoid strenuous physical activities and do not get the patches on the back wet. No showering until final patch reading done. Okay to take antihistamines for itching but avoid placing any creams on the back where the patches are. We will remove the patches on Wednesday and will do our initial read. Then you will come back on Friday for a final read  Allergic contact dermatitis - Instructions provided on care of the patches for the next 48 hours. Zachary Maynard was instructed to avoid showering for the next 48 hours. Zachary Maynard will follow up in 48 hours and 96 hours for patch readings.

## 2024-05-17 ENCOUNTER — Encounter: Admitting: Allergy and Immunology

## 2024-05-19 ENCOUNTER — Encounter: Admitting: Internal Medicine

## 2024-06-30 ENCOUNTER — Ambulatory Visit (HOSPITAL_BASED_OUTPATIENT_CLINIC_OR_DEPARTMENT_OTHER)
Admission: EM | Admit: 2024-06-30 | Discharge: 2024-06-30 | Disposition: A | Discharge status: Home / Self Care | Payer: Self-pay | Attending: Family Medicine | Admitting: Family Medicine

## 2024-06-30 ENCOUNTER — Encounter (HOSPITAL_BASED_OUTPATIENT_CLINIC_OR_DEPARTMENT_OTHER): Payer: Self-pay

## 2024-06-30 DIAGNOSIS — L209 Atopic dermatitis, unspecified: Secondary | ICD-10-CM

## 2024-06-30 DIAGNOSIS — L309 Dermatitis, unspecified: Secondary | ICD-10-CM

## 2024-06-30 MED ORDER — VTAMA 1 % EX CREA: 1.0000 | TOPICAL_CREAM | Freq: Every day | CUTANEOUS | 2 refills | Status: AC | PRN

## 2024-06-30 MED ORDER — TRIAMCINOLONE ACETONIDE 40 MG/ML IJ SUSP
40.0000 mg | Freq: Once | INTRAMUSCULAR | Status: AC
  Administered 2024-06-30: 40 mg via INTRAMUSCULAR

## 2024-06-30 MED ORDER — PREDNISONE 10 MG (21) PO TBPK: ORAL_TABLET | Freq: Every day | ORAL | 0 refills | Status: AC

## 2024-06-30 NOTE — ED Triage Notes (Signed)
 Hx of eczema.  States has been really bad for 2-3 weeks. Was using oral medication and cream but lost insurance. Hands are raw, areas of broken skin to forearms. Patient states he is miserable.

## 2024-06-30 NOTE — Discharge Instructions (Signed)
 We gave you a steroid shot here today.  I have also prescribed a prednisone  taper and send in some cream that you were previously on. Please follow-up with the allergist as planned

## 2024-06-30 NOTE — ED Provider Notes (Signed)
 [facility]    CSN: [id number] Arrival date & time: 06/30/24  1329      History   Chief Complaint Chief Complaint  Patient presents with   Skin disorder    HPI Zachary Maynard is a 28 y.o. male.   Pt is a 28 year old male that presents with rash. Hx of eczema.  States has been really bad for 2-3 weeks. Was using oral medication and cream but lost insurance. Hands are raw, areas of broken skin to forearms. Patient states he is miserable.       History reviewed. No pertinent past medical history.  There are no active problems to display for this patient.   History reviewed. No pertinent surgical history.     Home Medications    Prior to Admission medications   Medication Sig Start Date End Date Taking? Authorizing Provider  predniSONE  (STERAPRED UNI-PAK 21 TAB) 10 MG (21) TBPK tablet Take by mouth daily. Take 6 tabs by mouth daily  for 2 days, then 5 tabs for 2 days, then 4 tabs for 2 days, then 3 tabs for 2 days, 2 tabs for 2 days, then 1 tab by mouth daily for 2 days 06/30/24  Yes Destiny Hagin A, FNP  cetirizine (ZYRTEC) 10 MG tablet Take 10 mg by mouth at bedtime. Patient not taking: Reported on 04/13/2024 07/24/19   [provider]  CIBINQO 100 MG TABS Take 1 tablet by mouth daily. 04/13/24   [provider]  CLOBETASOL PROPIONATE E 0.05 % emollient cream Apply topically 2 (two) times daily. 04/11/24   [provider]  montelukast (SINGULAIR) 10 MG tablet Take 10 mg by mouth daily. 07/24/19   [provider]  omeprazole (PRILOSEC) 40 MG capsule Take 40 mg by mouth daily. 02/07/24   [provider]  Tapinarof  (VTAMA ) 1 % CREA Apply 1 Application topically daily as needed (to all areas of eczema). 06/30/24   Adah Wilbert LABOR, FNP    Family History Family History  Problem Relation Age of Onset   Asthma Niece    Immunodeficiency Neg Hx    Eczema Neg Hx    Angioedema Neg Hx    Allergic rhinitis Neg Hx    Atopy Neg Hx      Social History Social History   Tobacco Use   Smoking status: Every Day    Types: Cigarettes   Smokeless tobacco: Never   Tobacco comments:    A few years   Vaping Use   Vaping status: Some Days  Substance Use Topics   Alcohol use: Yes    Comment: weekends   Drug use: Never     Allergies   Patient has no known allergies.   Review of Systems Review of Systems   Physical Exam Triage Vital Signs ED Triage Vitals [06/30/24 1405]  Encounter Vitals Group     BP 113/71     Girls Systolic BP Percentile      Girls Diastolic BP Percentile      Boys Systolic BP Percentile      Boys Diastolic BP Percentile      Pulse Rate (!) 45     Resp 20     Temp 98.1 F (36.7 C)     Temp Source Oral     SpO2 98 %     Weight      Height      Head Circumference      Peak Flow      Pain Score  Pain Loc      Pain Education      Exclude from Growth Chart    No data found.  Updated Vital Signs BP 113/71 (BP Location: Right Arm)   Pulse (!) 45   Temp 98.1 F (36.7 C) (Oral)   Resp 20   SpO2 98%   Visual Acuity Right Eye Distance:   Left Eye Distance:   Bilateral Distance:    Right Eye Near:   Left Eye Near:    Bilateral Near:     Physical Exam Constitutional:      Appearance: Normal appearance.  Pulmonary:     Effort: Pulmonary effort is normal.  Musculoskeletal:        General: Normal range of motion.  Skin:    Findings: Rash present.     Comments: Bilateral hands swollen, dry cracked.  Bilateral forearms and back,  raised patches, dry, erythematous some bumps, blisters. Very inflamed   Neurological:     Mental Status: He is alert.  Psychiatric:        Mood and Affect: Mood normal.      UC Treatments / Results  Labs (all labs ordered are listed, but only abnormal results are displayed) Labs Reviewed - No data to display  EKG   Radiology No results found.  Procedures Procedures (including critical care time)  Medications Ordered in  UC Medications  triamcinolone  acetonide (KENALOG -40) injection 40 mg (40 mg Intramuscular Given 06/30/24 1426)    Initial Impression / Assessment and Plan / UC Course  I have reviewed the triage vital signs and the nursing notes.  Pertinent labs & imaging results that were available during my care of the patient were reviewed by me and considered in my medical decision making (see chart for details).     Eczema-patient with eczema flare.  He is currently out of all of his medication and has been unable to get into the allergist due to insurance.  Requesting refill on his cream and treatment for flareup.  I am giving steroid injection here today.  Also prescribing prednisone  pack, taper to take over the next 2 weeks. Cream refilled to apply to the areas Recommend emollients and Eucerin cream over-the-counter Follow-up with the allergist as planned Final Clinical Impressions(s) / UC Diagnoses   Final diagnoses:  Atopic dermatitis, unspecified type  Eczema, unspecified type     Discharge Instructions      We gave you a steroid shot here today.  I have also prescribed a prednisone  taper and send in some cream that you were previously on. Please follow-up with the allergist as planned    ED Prescriptions     Medication Sig Dispense Auth. Provider   Tapinarof  (VTAMA ) 1 % CREA Apply 1 Application topically daily as needed (to all areas of eczema). 60 g Cindra Austad A, FNP   predniSONE  (STERAPRED UNI-PAK 21 TAB) 10 MG (21) TBPK tablet Take by mouth daily. Take 6 tabs by mouth daily  for 2 days, then 5 tabs for 2 days, then 4 tabs for 2 days, then 3 tabs for 2 days, 2 tabs for 2 days, then 1 tab by mouth daily for 2 days 42 tablet Loisann Roach A, FNP      PDMP not reviewed this encounter.   Adah Wilbert LABOR, FNP 07/01/24 [phone]
